# Patient Record
Sex: Female | Born: 1985 | Race: White | Hispanic: No | Marital: Married
Health system: Southern US, Community
[De-identification: ages and names within clinical notes are randomized; demographics above are authoritative.]

## PROBLEM LIST (undated history)

## (undated) DIAGNOSIS — R0789 Other chest pain: Secondary | ICD-10-CM

## (undated) HISTORY — DX: Other chest pain: R07.89

---

## 2015-12-21 DIAGNOSIS — F3342 Major depressive disorder, recurrent, in full remission: Secondary | ICD-10-CM | POA: Insufficient documentation

## 2015-12-21 DIAGNOSIS — K5190 Ulcerative colitis, unspecified, without complications: Secondary | ICD-10-CM | POA: Insufficient documentation

## 2015-12-21 DIAGNOSIS — F411 Generalized anxiety disorder: Secondary | ICD-10-CM | POA: Insufficient documentation

## 2016-07-06 DIAGNOSIS — F3174 Bipolar disorder, in full remission, most recent episode manic: Secondary | ICD-10-CM | POA: Insufficient documentation

## 2018-08-16 ENCOUNTER — Other Ambulatory Visit: Payer: Self-pay

## 2018-08-16 ENCOUNTER — Emergency Department (HOSPITAL_COMMUNITY): Payer: BLUE CROSS/BLUE SHIELD

## 2018-08-16 ENCOUNTER — Encounter (HOSPITAL_COMMUNITY): Payer: Self-pay | Admitting: Pharmacy Technician

## 2018-08-16 ENCOUNTER — Emergency Department (HOSPITAL_COMMUNITY)
Admission: EM | Admit: 2018-08-16 | Discharge: 2018-08-16 | Disposition: A | Discharge status: Home / Self Care | Payer: BLUE CROSS/BLUE SHIELD | Attending: Emergency Medicine | Admitting: Emergency Medicine

## 2018-08-16 DIAGNOSIS — Z79899 Other long term (current) drug therapy: Secondary | ICD-10-CM | POA: Insufficient documentation

## 2018-08-16 DIAGNOSIS — R079 Chest pain, unspecified: Secondary | ICD-10-CM | POA: Diagnosis present

## 2018-08-16 DIAGNOSIS — R0789 Other chest pain: Secondary | ICD-10-CM | POA: Diagnosis not present

## 2018-08-16 LAB — URINALYSIS, ROUTINE W REFLEX MICROSCOPIC
Bilirubin Urine: NEGATIVE
Glucose, UA: NEGATIVE mg/dL
Hgb urine dipstick: NEGATIVE
Ketones, ur: NEGATIVE mg/dL
Leukocytes,Ua: NEGATIVE
Nitrite: NEGATIVE
Protein, ur: NEGATIVE mg/dL
Specific Gravity, Urine: 1.01 (ref 1.005–1.030)
pH: 7 (ref 5.0–8.0)

## 2018-08-16 LAB — BASIC METABOLIC PANEL
Anion gap: 7 (ref 5–15)
BUN: 14 mg/dL (ref 6–20)
CO2: 24 mmol/L (ref 22–32)
Calcium: 9.6 mg/dL (ref 8.9–10.3)
Chloride: 104 mmol/L (ref 98–111)
Creatinine, Ser: 0.91 mg/dL (ref 0.44–1.00)
GFR calc Af Amer: >60 mL/min (ref >60)
GFR calc non Af Amer: >60 mL/min (ref >60)
Glucose, Bld: 85 mg/dL (ref 70–99)
Potassium: 4.1 mmol/L (ref 3.5–5.1)
Sodium: 135 mmol/L (ref 135–145)

## 2018-08-16 LAB — CBC WITH DIFFERENTIAL/PLATELET
Abs Immature Granulocytes: 0.02 10*3/uL (ref 0.00–0.07)
Basophils Absolute: 0 10*3/uL (ref 0.0–0.1)
Basophils Relative: 0 %
EOS PCT: 3 %
Eosinophils Absolute: 0.2 10*3/uL (ref 0.0–0.5)
HCT: 46.8 % — ABNORMAL HIGH (ref 36.0–46.0)
HEMOGLOBIN: 14.9 g/dL (ref 12.0–15.0)
Immature Granulocytes: 0 %
Lymphocytes Relative: 26 %
Lymphs Abs: 1.5 10*3/uL (ref 0.7–4.0)
MCH: 30.1 pg (ref 26.0–34.0)
MCHC: 31.8 g/dL (ref 30.0–36.0)
MCV: 94.5 fL (ref 80.0–100.0)
Monocytes Absolute: 0.3 10*3/uL (ref 0.1–1.0)
Monocytes Relative: 6 %
Neutro Abs: 3.7 10*3/uL (ref 1.7–7.7)
Neutrophils Relative %: 65 %
Platelets: 234 10*3/uL (ref 150–400)
RBC: 4.95 MIL/uL (ref 3.87–5.11)
RDW: 12.1 % (ref 11.5–15.5)
WBC: 5.8 10*3/uL (ref 4.0–10.5)
nRBC: 0 % (ref 0.0–0.2)

## 2018-08-16 LAB — TROPONIN I: Troponin I: <0.03 ng/mL (ref <0.03)

## 2018-08-16 LAB — LIPASE, BLOOD: Lipase: 30 U/L (ref 11–51)

## 2018-08-16 LAB — POC URINE PREG, ED: Preg Test, Ur: NEGATIVE

## 2018-08-16 MED ORDER — ALUM & MAG HYDROXIDE-SIMETH 200-200-20 MG/5ML PO SUSP
30.0000 mL | Freq: Once | ORAL | Status: AC
  Administered 2018-08-16: 30 mL via ORAL
  Filled 2018-08-16: qty 30

## 2018-08-16 NOTE — ED Provider Notes (Signed)
[facility] EMERGENCY DEPARTMENT Provider Note   CSN: [id number] Arrival date & time: 08/16/18  1144    History   Chief Complaint Chief Complaint  Patient presents with  . Chest Pain    HPI Cassandra Stevens is a 33 y.o. female history of anxiety and bipolar presenting to emergency department today with chief complaint of chest pain and epigastric pain.  Patient reports her epigastric pain started x3 days ago.  She describes a burning sensation.  She thought anxiety was the cause so she took 2 Xanax.  She did not have symptom relief.  The pain eventually resolved.  She has a history of this epigastric pain and states it felt similar.  Patient reports chest pain x2 days.  She describes pain as being located in the middle of her chest and radiates to the left breast.  Admits to associated jaw tightness and left arm numbness.  The pain started when she was sitting in bed.  She admits to associated nausea and diaphoresis.  The pain has been constant x2 days however she reports right now it is slightly improved.  She rates the pain currently 6 out of 10 in severity.  No aggravating factors.  Patient reports she had similar chest pain x10 years ago and had it worked up by her cardiologist in [place].  There was no diagnosis, they thought chest pain caused from anxiety. Denies long period of immobilization, estrogen use, she denies tobacco use, recent surgery, calf pain, leg swelling.    History reviewed. No pertinent past medical history.  There are no active problems to display for this patient.   History reviewed. No pertinent surgical history.   OB History   No obstetric history on file.      Home Medications    Prior to Admission medications   Medication Sig Start Date End Date Taking? Authorizing Provider  ALPRAZolam Prudy Feeler) 1 MG tablet Take 1 mg by mouth at bedtime as needed for anxiety.   Yes [provider]  lamoTRIgine (LAMICTAL) 200 MG tablet  Take 200 mg by mouth daily.   Yes [provider]  lithium carbonate 300 MG capsule Take 900 mg by mouth at bedtime.   Yes [provider]  magnesium oxide (MAG-OX) 400 MG tablet Take 400 mg by mouth at bedtime.   Yes [provider]  omega-3 acid ethyl esters (LOVAZA) 1 g capsule Take 1 g by mouth daily.   Yes [provider]    Family History No family history on file.  Social History Social History   Tobacco Use  . Smoking status: Not on file  Substance Use Topics  . Alcohol use: Not on file  . Drug use: Not on file     Allergies   Ceclor [cefaclor]   Review of Systems Review of Systems  Cardiovascular: Positive for chest pain. Negative for leg swelling.  All other systems reviewed and are negative.    Physical Exam Updated Vital Signs BP 112/81   Pulse 70   Temp 98.2 F (36.8 C)   Resp 20   Ht 5\' 5"  (1.651 m)   Wt 56.7 kg   SpO2 100%   BMI 20.80 kg/m   Physical Exam Vitals signs and nursing note reviewed.  Constitutional:      Appearance: She is not ill-appearing or toxic-appearing.  HENT:     Head: Normocephalic and atraumatic.     Nose: Nose normal.     Mouth/Throat:  Mouth: Mucous membranes are moist.     Pharynx: Oropharynx is clear.  Eyes:     Conjunctiva/sclera: Conjunctivae normal.  Neck:     Musculoskeletal: Normal range of motion.  Cardiovascular:     Rate and Rhythm: Normal rate and regular rhythm.     Pulses: Normal pulses.          Carotid pulses are 2+ on the right side and 2+ on the left side.    Heart sounds: Normal heart sounds.  Pulmonary:     Effort: Pulmonary effort is normal.     Breath sounds: Normal breath sounds.  Abdominal:     General: There is no distension.     Palpations: Abdomen is soft.     Tenderness: There is no abdominal tenderness. There is no guarding or rebound.  Musculoskeletal: Normal range of motion.     Comments: Negative Homans sign bilaterally.  No lower  extremity edema or tenderness bilaterally.  Skin:    General: Skin is warm and dry.     Capillary Refill: Capillary refill takes less than 2 seconds.  Neurological:     Mental Status: She is alert. Mental status is at baseline.     Motor: No weakness.  Psychiatric:        Behavior: Behavior normal.      ED Treatments / Results  Labs (all labs ordered are listed, but only abnormal results are displayed) Labs Reviewed  CBC WITH DIFFERENTIAL/PLATELET - Abnormal; Notable for the following components:      Result Value   HCT 46.8 (*)    All other components within normal limits  URINALYSIS, ROUTINE W REFLEX MICROSCOPIC - Abnormal; Notable for the following components:   Color, Urine STRAW (*)    All other components within normal limits  BASIC METABOLIC PANEL  TROPONIN I  LIPASE, BLOOD  POC URINE PREG, ED    EKG None  Radiology Dg Chest 2 View  Result Date: 08/16/2018 CLINICAL DATA:  Chest pain EXAM: CHEST - 2 VIEW COMPARISON:  None. FINDINGS: The heart size and mediastinal contours are within normal limits. Both lungs are clear. The visualized skeletal structures are unremarkable. IMPRESSION: No acute abnormality of the lungs.  No focal airspace opacity. Electronically Signed   By: Lauralyn PrimesAlex  Bibbey M.D.   On: 08/16/2018 13:18    Procedures Procedures (including critical care time)  Medications Ordered in ED Medications  alum & mag hydroxide-simeth (MAALOX/MYLANTA) 200-200-20 MG/5ML suspension 30 mL (30 mLs Oral Given 08/16/18 1337)     Initial Impression / Assessment and Plan / ED Course  I have reviewed the triage vital signs and the nursing notes.  Pertinent labs & imaging results that were available during my care of the patient were reviewed by me and considered in my medical decision making (see chart for details).     Pt has low risk heart score of 1.  Patient is to be discharged with recommendation to follow up with PCP in regards to today's hospital visit. Chest  pain is not likely of cardiac or pulmonary etiology d/t presentation, PERC negative, VSS, no tracheal deviation, no JVD or new murmur, RRR, breath sounds equal bilaterally. Work-up in the ER unremarkable. Labs reviewed, no leukocytosis, anemia, or significant electrolyte abnormality. CXR without infiltrate, effusion, pneumothorax, or fracture/dislocation. EKG without acute abnormalities, negative troponin. Pt does not want to stay for 3 hour troponin. I recommend she should, but pt reports chest pain has improved and she will follow up with pcp and  cardiology outpatient.  Patient has appeared hemodynamically stable throughout ER visit.  Pt has been advised to return to the ED if CP becomes exertional, associated with diaphoresis or nausea, radiates to left jaw/arm, worsens or becomes concerning in any way. Pt appears reliable for follow up and is agreeable to discharge. Provided opportunity for questions, patient confirmed understanding and is in agreement with plan.   Case has been discussed with Dr. Donnald Garre who agrees with the above plan to discharge.        Final Clinical Impressions(s) / ED Diagnoses   Final diagnoses:  Atypical chest pain    ED Discharge Orders         Ordered    Ambulatory referral to Cardiology     08/16/18 [address], Mliss Sax 08/17/18 0000    Arby Barrette, MD 08/17/18 [phone]

## 2018-08-16 NOTE — Discharge Instructions (Addendum)
Read instructions below for reasons to return to the Emergency Department. It is recommended that your follow up with your Primary Care Doctor in regards to today's visit. If you do not have a doctor, use the resource guide to help you find one.   An ambulatory outpatient cardiology referral has been sent to the cardiologist office on [facility].  They should call you to schedule follow-up appointment.  However if you do not hear from them by the middle of next week you can call the number listed and set up an appointment.  Tests performed today include: An EKG of your heart A chest x-ray Cardiac enzymes - a blood test for heart muscle damage Blood counts and electrolytes  Chest Pain (Nonspecific)  HOME CARE INSTRUCTIONS  -For the next few days, avoid physical activities that bring on chest pain. Continue physical activities as directed.  -Do not smoke cigarettes or drink alcohol until your symptoms are gone. If you do smoke, it is time to quit. You may receive instructions and counseling on how to stop smoking. Only take over-the-counter or prescription medicine for pain, discomfort, or fever as directed by your caregiver.  -Follow your caregiver's suggestions for further testing if your chest pain does not go away.  -Keep any follow-up appointments you made. If you do not go to an appointment, you could develop lasting (chronic) problems with pain. If there is any problem keeping an appointment, you must call to reschedule.   SEEK MEDICAL CARE IF:  You think you are having problems from the medicine you are taking. Read your medicine instructions carefully.  Your chest pain does not go away, even after treatment.  You develop a rash with blisters on your chest.   SEEK IMMEDIATE MEDICAL CARE IF:  You have increased chest pain or pain that spreads to your arm, neck, jaw, back, or belly (abdomen).  You develop shortness of breath, an increasing cough, or you are coughing up blood.  You  have severe back or abdominal pain, feel sick to your stomach (nauseous) or throw up (vomit).  You develop severe weakness, fainting, or chills.  You have an oral temperature above 102 F (38.9 C), not controlled by medicine.   THIS IS AN EMERGENCY. Do not wait to see if the pain will go away. Get medical help at once. Call 911. Do not drive yourself to the hospital.

## 2018-08-16 NOTE — ED Triage Notes (Signed)
Pt arrives via POV with c/o epigastric and cp onset 3 days ago. Progressively getting worse. Went to UC and had ekg and cxr done that were normal.

## 2018-08-23 ENCOUNTER — Encounter: Payer: Self-pay | Admitting: Cardiovascular Disease

## 2018-08-23 ENCOUNTER — Ambulatory Visit: Payer: BLUE CROSS/BLUE SHIELD | Admitting: Cardiovascular Disease

## 2018-08-23 VITALS — BP 100/72 | HR 69 | Ht 65.0 in | Wt 127.8 lb

## 2018-08-23 DIAGNOSIS — R079 Chest pain, unspecified: Secondary | ICD-10-CM

## 2018-08-23 DIAGNOSIS — R0789 Other chest pain: Secondary | ICD-10-CM | POA: Insufficient documentation

## 2018-08-23 DIAGNOSIS — E010 Iodine-deficiency related diffuse (endemic) goiter: Secondary | ICD-10-CM | POA: Diagnosis not present

## 2018-08-23 HISTORY — DX: Other chest pain: R07.89

## 2018-08-23 NOTE — Progress Notes (Signed)
Cardiology Office Note   Date:  08/23/2018   ID:  Cassandra Stevens, DOB [birth year], MRN [id number]  PCP:  Patient, No Pcp Per  Cardiologist:   Skeet Latch, MD   No chief complaint on file.     History of Present Illness: Cassandra Stevens is a 33 y.o. female with anxiety and bipolar disorder who is being seen today for the evaluation of chest pain at the request of Albrizze, Harley Hallmark, PA*.  She was seen in the ED 08/16/18 with chest and epigastric pain.  For the last month she has experienced constant chest and epigastric discomfort.  It ranges from 3 out of 10-10 out of 10 in severity and is dull in quality.  It is epigastric and substernal and radiates into her L breast.  She occasionally has pleuritic discomfort but not always.  It seems to be better when lying down but does not change with leaning forward.  She had no upper respiratory illness prior to developing the chest discomfort but did have a cold after she had been experiencing discomfort for about 2 weeks.  She has not experienced any lower extremity edema, orthopnea, or PND.  She exercises regularly and has not experienced any chest discomfort with exercise.  However in the last several week she has not been exercising because she is afraid of her symptoms.  She was seen in urgent care and all her labs and EKG were unremarkable.  She tried taking omeprazole and a GI cocktail without relief.  She also tried Xanax which did not help.  She thought that maybe it was a pulled muscle and tried taking ibuprofen 3 times daily without relief.  She continues to have the discomfort but it is 3 out of 10 and more tolerable.  She denies palpitations, lightheadedness or dizziness.  She saw a cardiologist for chest pain 10 years ago that was thought to be due to her [employer]. She did not undergo and cardiac testing at the time.       Past Medical History:  Diagnosis Date  . Atypical chest pain 08/23/2018   Anxiety Bipolar  disorder Ulcerative colitis  No past surgical history on file.   Current Outpatient Medications  Medication Sig Dispense Refill  . ALPRAZolam (XANAX) 1 MG tablet Take 1 mg by mouth at bedtime as needed for anxiety.    . lamoTRIgine (LAMICTAL) 200 MG tablet Take 200 mg by mouth daily.    Marland Kitchen lithium carbonate 300 MG capsule Take 900 mg by mouth at bedtime.    . magnesium oxide (MAG-OX) 400 MG tablet Take 400 mg by mouth at bedtime.    Marland Kitchen omega-3 acid ethyl esters (LOVAZA) 1 g capsule Take 1 g by mouth daily.     No current facility-administered medications for this visit.     Allergies:   Cefaclor    Social History:  The patient     Family History:  The patient's family history includes COPD in her maternal grandfather; Heart attack in her paternal grandfather and paternal grandmother; Hyperlipidemia in her father and mother; Hypertension in her father, maternal grandmother, and mother.    ROS:  Please see the history of present illness.   Otherwise, review of systems are positive for none.   All other systems are reviewed and negative.    PHYSICAL EXAM: VS:  BP 100/72   Pulse 69   Ht '5\' 5"'$  (1.651 m)   Wt 127 lb 12.8 oz (58 kg)   SpO2  99%   BMI 21.27 kg/m  , BMI Body mass index is 21.27 kg/m. GENERAL:  Well appearing HEENT:  Pupils equal round and reactive, fundi not visualized, oral mucosa unremarkable NECK:  No jugular venous distention, waveform within normal limits, carotid upstroke brisk and symmetric, no bruits, +thyromegaly.  No nodules LUNGS:  Clear to auscultation bilaterally HEART:  RRR.  PMI not displaced or sustained,S1 and S2 within normal limits, no S3, no S4, no clicks, no rubs, no murmurs ABD:  Flat, positive bowel sounds normal in frequency in pitch, no bruits, no rebound, no guarding, no midline pulsatile mass, no hepatomegaly, no splenomegaly EXT:  2 plus pulses throughout, no edema, no cyanosis no clubbing SKIN:  No rashes no nodules NEURO:  Cranial nerves  II through XII grossly intact, motor grossly intact throughout PSYCH:  Cognitively intact, oriented to person place and time   EKG:  EKG is ordered today. The ekg ordered today demonstrates sinus rhythm.  Rate 69 bpm.    Recent Labs: 08/16/2018: BUN 14; Creatinine, Ser 0.91; Hemoglobin 14.9; Platelets 234; Potassium 4.1; Sodium 135    Lipid Panel No results found for: CHOL, TRIG, HDL, CHOLHDL, VLDL, LDLCALC, LDLDIRECT    Wt Readings from Last 3 Encounters:  08/23/18 127 lb 12.8 oz (58 kg)  08/16/18 125 lb (56.7 kg)      ASSESSMENT AND PLAN:  # Atypical chest pain:  Symptoms have been ongoing for a month without relief.  This would be very atypical for ischemia.  Also, there no no exertional component, though she isn't exercising as often as usual.  She has no risk factors for premature CAD (other than maternal grandparents with MI in 42s).  We will check an echo and an ESR given that she sometimes has pleuritic symptoms.  We will also check a TSH, especially given that she seems to have thyromegaly on exam.  If these are unremarkable, would follow up with primary care.    Current medicines are reviewed at length with the patient today.  The patient does not have concerns regarding medicines.  The following changes have been made:  no change  Labs/ tests ordered today include:   Orders Placed This Encounter  Procedures  . TSH  . Sed Rate (ESR)  . EKG 12-Lead  . ECHOCARDIOGRAM COMPLETE     Disposition:   FU with Kerilyn Cortner C. Oval Linsey, MD, [facility] as needed.      Signed, Daemion Mcniel C. Oval Linsey, MD, [facility]  08/23/2018 2:24 PM    [place]

## 2018-08-23 NOTE — Patient Instructions (Signed)
Medication Instructions:  Your physician recommends that you continue on your current medications as directed. Please refer to the Current Medication list given to you today.  If you need a refill on your cardiac medications before your next appointment, please call your pharmacy.   Lab work: TSH/SED RATE TODAY   If you have labs (blood work) drawn today and your tests are completely normal, you will receive your results only by: Marland Kitchen MyChart Message (if you have MyChart) OR . A paper copy in the mail If you have any lab test that is abnormal or we need to change your treatment, we will call you to review the results.  Testing/Procedures: Your physician has requested that you have an echocardiogram. Echocardiography is a painless test that uses sound waves to create images of your heart. It provides your doctor with information about the size and shape of your heart and how well your heart's chambers and valves are working. This procedure takes approximately one hour. There are no restrictions for this procedure. [facility] AT [address]  Follow-Up: AS NEEDED  Any Other Special Instructions Will Be Listed Below (If Applicable).  Echocardiogram An echocardiogram is a procedure that uses painless sound waves (ultrasound) to produce an image of the heart. Images from an echocardiogram can provide important information about:  Signs of coronary artery disease (CAD).  Aneurysm detection. An aneurysm is a weak or damaged part of an artery wall that bulges out from the normal force of blood pumping through the body.  Heart size and shape. Changes in the size or shape of the heart can be associated with certain conditions, including heart failure, aneurysm, and CAD.  Heart muscle function.  Heart valve function.  Signs of a past heart attack.  Fluid buildup around the heart.  Thickening of the heart muscle.  A tumor or infectious growth around the heart valves. Tell a  health care provider about:  Any allergies you have.  All medicines you are taking, including vitamins, herbs, eye drops, creams, and over-the-counter medicines.  Any blood disorders you have.  Any surgeries you have had.  Any medical conditions you have.  Whether you are pregnant or may be pregnant. What are the risks? Generally, this is a safe procedure. However, problems may occur, including:  Allergic reaction to dye (contrast) that may be used during the procedure. What happens before the procedure? No specific preparation is needed. You may eat and drink normally. What happens during the procedure?   An IV tube may be inserted into one of your veins.  You may receive contrast through this tube. A contrast is an injection that improves the quality of the pictures from your heart.  A gel will be applied to your chest.  A wand-like tool (transducer) will be moved over your chest. The gel will help to transmit the sound waves from the transducer.  The sound waves will harmlessly bounce off of your heart to allow the heart images to be captured in real-time motion. The images will be recorded on a computer. The procedure may vary among health care providers and hospitals. What happens after the procedure?  You may return to your normal, everyday life, including diet, activities, and medicines, unless your health care provider tells you not to do that. Summary  An echocardiogram is a procedure that uses painless sound waves (ultrasound) to produce an image of the heart.  Images from an echocardiogram can provide important information about the size  and shape of your heart, heart muscle function, heart valve function, and fluid buildup around your heart.  You do not need to do anything to prepare before this procedure. You may eat and drink normally.  After the echocardiogram is completed, you may return to your normal, everyday life, unless your health care provider tells  you not to do that. This information is not intended to replace advice given to you by your health care provider. Make sure you discuss any questions you have with your health care provider. Document Released: 06/09/2000 Document Revised: 07/15/2016 Document Reviewed: 07/15/2016 Elsevier Interactive Patient Education  2019 [employer].

## 2018-08-24 LAB — SEDIMENTATION RATE: Sed Rate: 5 mm/hr (ref 0–32)

## 2018-08-24 LAB — TSH: TSH: 2.24 u[IU]/mL (ref 0.450–4.500)

## 2018-08-25 ENCOUNTER — Encounter: Payer: Self-pay | Admitting: Cardiovascular Disease

## 2018-08-28 ENCOUNTER — Ambulatory Visit (HOSPITAL_COMMUNITY): Payer: BLUE CROSS/BLUE SHIELD | Attending: Cardiology

## 2018-08-28 DIAGNOSIS — R079 Chest pain, unspecified: Secondary | ICD-10-CM | POA: Insufficient documentation

## 2020-03-22 IMAGING — CR DG CHEST 2V
2 series · 2 of 2 positions shown · non-contrast
Comparison: None.

CLINICAL DATA: Chest pain

EXAM:
CHEST - 2 VIEW

[chest pa]
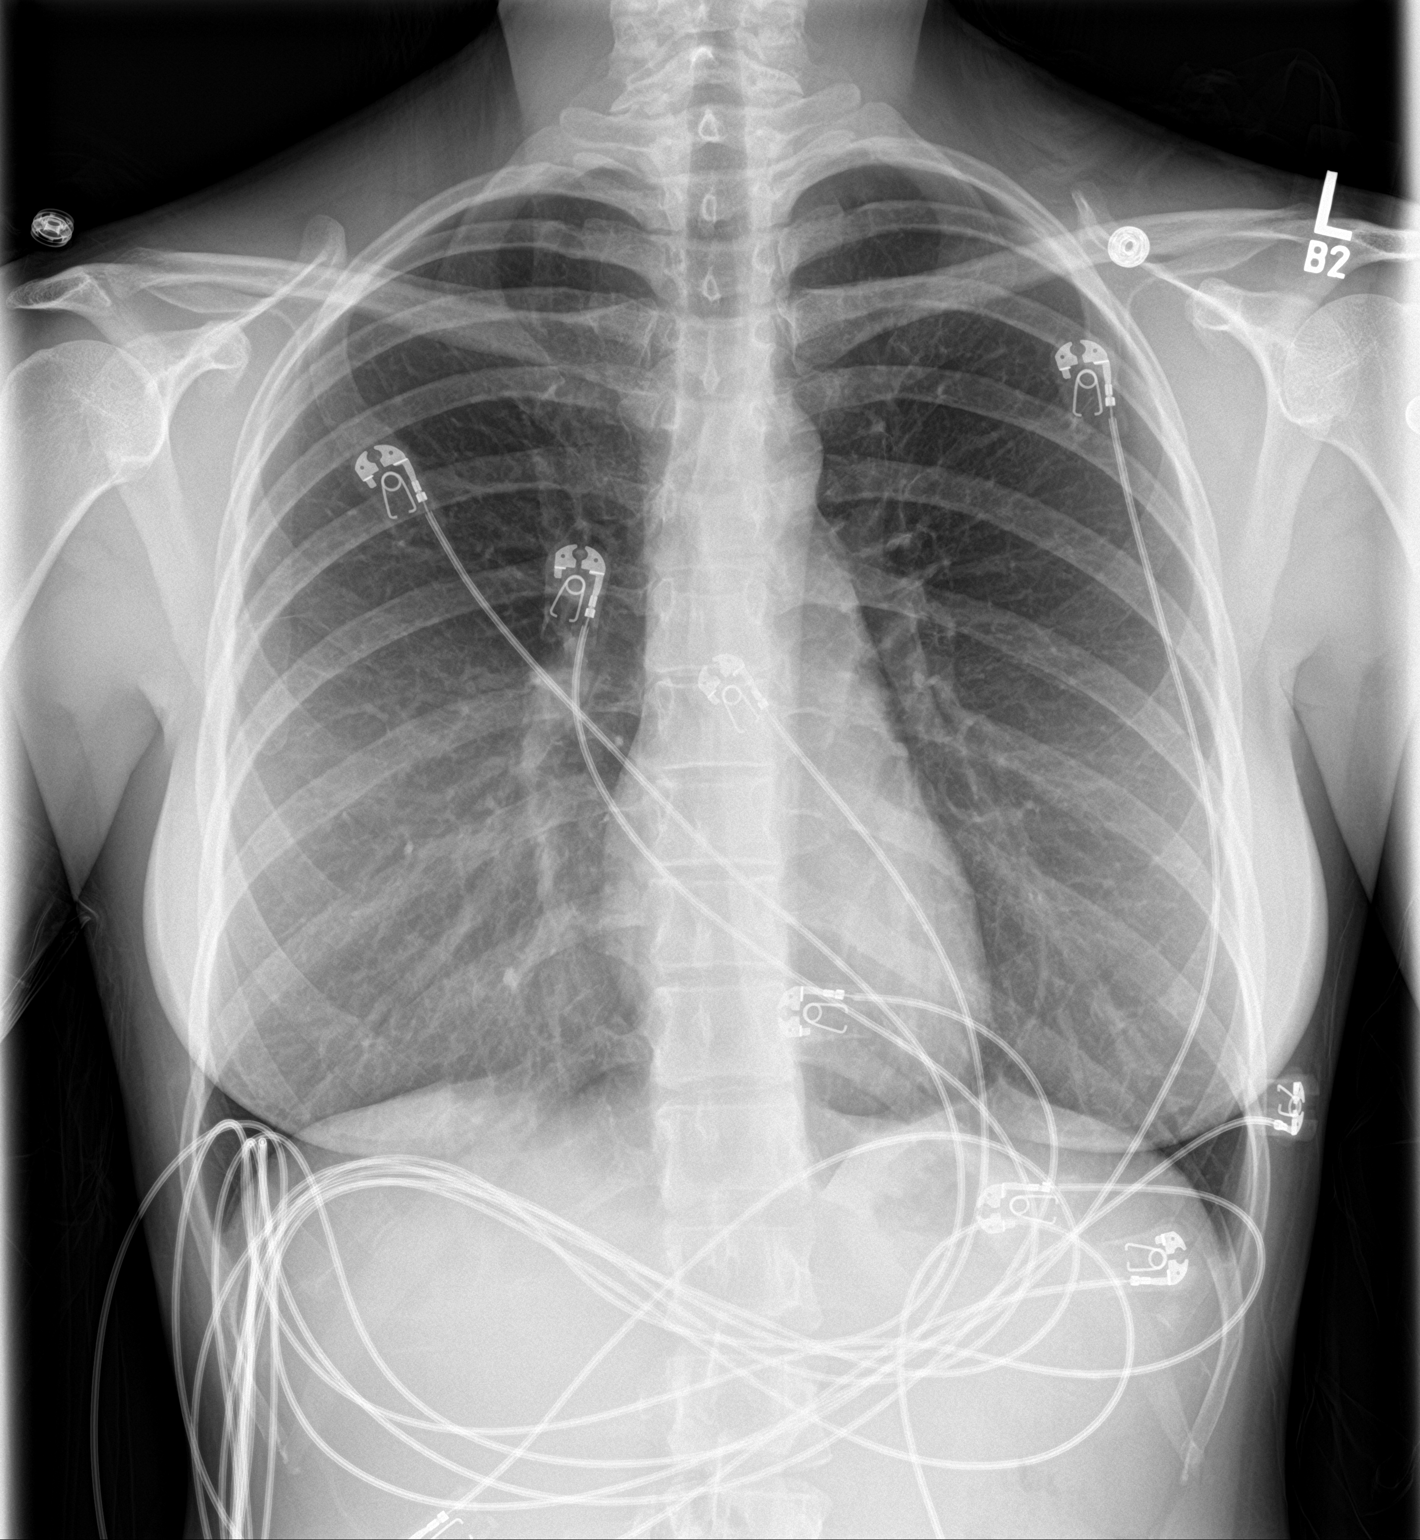

[chest lat]
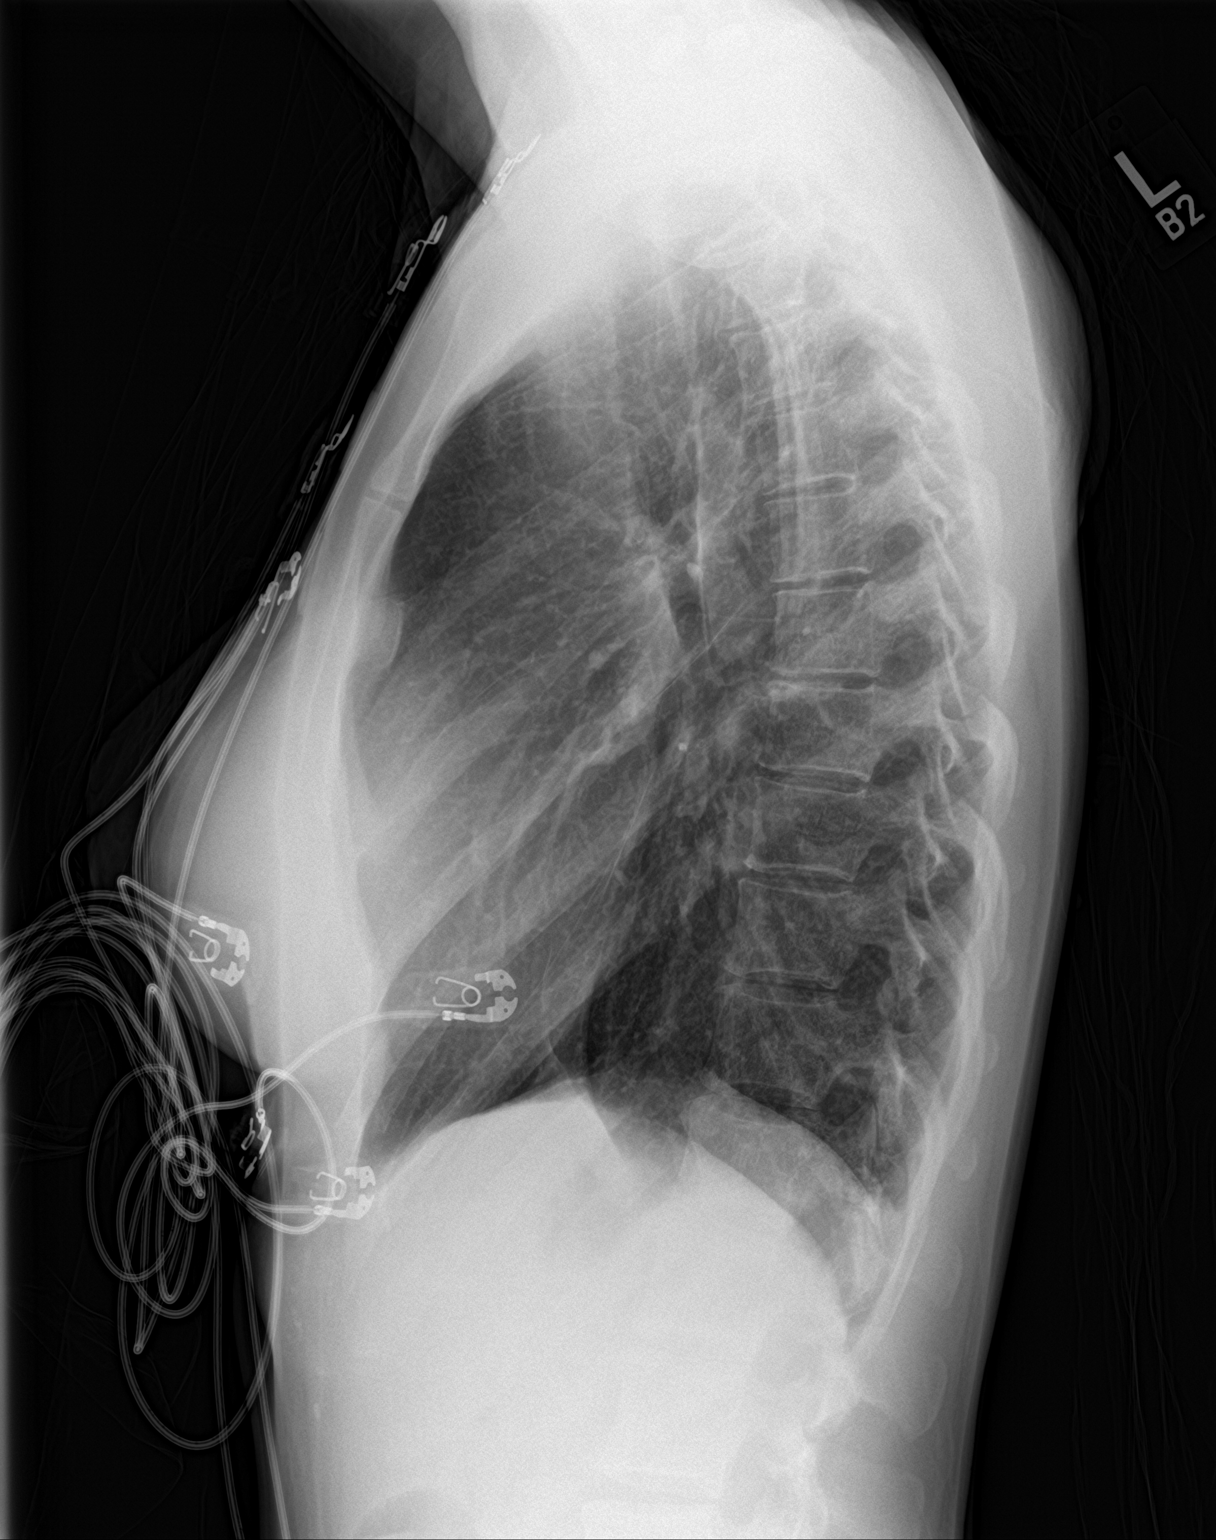

[2 of 2 positions shown; findings below may reference images not displayed]

FINDINGS: The heart size and mediastinal contours are within normal limits.
Both lungs are clear. The visualized skeletal structures are
unremarkable.
IMPRESSION: No acute abnormality of the lungs.  No focal airspace opacity.
